# Patient Record
Sex: Male | Born: 1985 | Race: Black or African American | Hispanic: No | Marital: Married | Smoking: Current every day smoker
Health system: Southern US, Community
[De-identification: ages and names within clinical notes are randomized; demographics above are authoritative.]

## PROBLEM LIST (undated history)

## (undated) DIAGNOSIS — I10 Essential (primary) hypertension: Secondary | ICD-10-CM

## (undated) DIAGNOSIS — K529 Noninfective gastroenteritis and colitis, unspecified: Secondary | ICD-10-CM

## (undated) HISTORY — PX: APPENDECTOMY: SHX54

## (undated) HISTORY — PX: OTHER SURGICAL HISTORY: SHX169

---

## 2008-07-30 ENCOUNTER — Emergency Department: Payer: Self-pay | Admitting: Emergency Medicine

## 2008-08-02 ENCOUNTER — Emergency Department: Payer: Self-pay | Admitting: Emergency Medicine

## 2008-08-03 ENCOUNTER — Inpatient Hospital Stay: Payer: Self-pay | Admitting: Internal Medicine

## 2008-08-20 ENCOUNTER — Inpatient Hospital Stay: Payer: Self-pay | Admitting: Unknown Physician Specialty

## 2009-07-14 ENCOUNTER — Emergency Department: Payer: Self-pay | Admitting: Emergency Medicine

## 2014-08-03 ENCOUNTER — Emergency Department: Payer: Self-pay | Admitting: Emergency Medicine

## 2021-01-08 ENCOUNTER — Ambulatory Visit
Admission: EM | Admit: 2021-01-08 | Discharge: 2021-01-08 | Disposition: A | Discharge status: Home / Self Care | Payer: 59 | Attending: Sports Medicine | Admitting: Sports Medicine

## 2021-01-08 ENCOUNTER — Ambulatory Visit
Admission: RE | Admit: 2021-01-08 | Discharge: 2021-01-08 | Disposition: A | Discharge status: Home / Self Care | Payer: 59 | Source: Ambulatory Visit | Attending: Sports Medicine | Admitting: Sports Medicine

## 2021-01-08 ENCOUNTER — Other Ambulatory Visit: Payer: Self-pay

## 2021-01-08 ENCOUNTER — Encounter: Payer: Self-pay | Admitting: Emergency Medicine

## 2021-01-08 DIAGNOSIS — Z841 Family history of disorders of kidney and ureter: Secondary | ICD-10-CM | POA: Insufficient documentation

## 2021-01-08 DIAGNOSIS — Z20822 Contact with and (suspected) exposure to covid-19: Secondary | ICD-10-CM | POA: Diagnosis not present

## 2021-01-08 DIAGNOSIS — I10 Essential (primary) hypertension: Secondary | ICD-10-CM

## 2021-01-08 DIAGNOSIS — R519 Headache, unspecified: Secondary | ICD-10-CM | POA: Insufficient documentation

## 2021-01-08 DIAGNOSIS — F17210 Nicotine dependence, cigarettes, uncomplicated: Secondary | ICD-10-CM | POA: Diagnosis not present

## 2021-01-08 DIAGNOSIS — H539 Unspecified visual disturbance: Secondary | ICD-10-CM

## 2021-01-08 DIAGNOSIS — Z880 Allergy status to penicillin: Secondary | ICD-10-CM | POA: Diagnosis not present

## 2021-01-08 DIAGNOSIS — R059 Cough, unspecified: Secondary | ICD-10-CM | POA: Insufficient documentation

## 2021-01-08 HISTORY — DX: Essential (primary) hypertension: I10

## 2021-01-08 LAB — BASIC METABOLIC PANEL
Anion gap: 5 (ref 5–15)
BUN: 14 mg/dL (ref 6–20)
CO2: 32 mmol/L (ref 22–32)
Calcium: 9.3 mg/dL (ref 8.9–10.3)
Chloride: 101 mmol/L (ref 98–111)
Creatinine, Ser: 0.92 mg/dL (ref 0.61–1.24)
GFR, Estimated: >60 mL/min (ref >60)
Glucose, Bld: 98 mg/dL (ref 70–99)
Potassium: 4.3 mmol/L (ref 3.5–5.1)
Sodium: 138 mmol/L (ref 135–145)

## 2021-01-08 LAB — URINALYSIS, COMPLETE (UACMP) WITH MICROSCOPIC
Bacteria, UA: NONE SEEN
Bilirubin Urine: NEGATIVE
Glucose, UA: NEGATIVE mg/dL
Hgb urine dipstick: NEGATIVE
Ketones, ur: NEGATIVE mg/dL
Leukocytes,Ua: NEGATIVE
Nitrite: NEGATIVE
Protein, ur: NEGATIVE mg/dL
RBC / HPF: NONE SEEN RBC/hpf (ref 0–5)
Specific Gravity, Urine: 1.025 (ref 1.005–1.030)
Squamous Epithelial / HPF: NONE SEEN (ref 0–5)
pH: 6.5 (ref 5.0–8.0)

## 2021-01-08 LAB — CBC WITH DIFFERENTIAL/PLATELET
Abs Immature Granulocytes: 0.03 10*3/uL (ref 0.00–0.07)
Basophils Absolute: 0 10*3/uL (ref 0.0–0.1)
Basophils Relative: 1 %
Eosinophils Absolute: 0.2 10*3/uL (ref 0.0–0.5)
Eosinophils Relative: 2 %
HCT: 47.3 % (ref 39.0–52.0)
Hemoglobin: 15.8 g/dL (ref 13.0–17.0)
Immature Granulocytes: 0 %
Lymphocytes Relative: 40 %
Lymphs Abs: 2.7 10*3/uL (ref 0.7–4.0)
MCH: 29.7 pg (ref 26.0–34.0)
MCHC: 33.4 g/dL (ref 30.0–36.0)
MCV: 88.9 fL (ref 80.0–100.0)
Monocytes Absolute: 0.4 10*3/uL (ref 0.1–1.0)
Monocytes Relative: 6 %
Neutro Abs: 3.4 10*3/uL (ref 1.7–7.7)
Neutrophils Relative %: 51 %
Platelets: 262 10*3/uL (ref 150–400)
RBC: 5.32 MIL/uL (ref 4.22–5.81)
RDW: 13.1 % (ref 11.5–15.5)
WBC: 6.7 10*3/uL (ref 4.0–10.5)
nRBC: 0 % (ref 0.0–0.2)

## 2021-01-08 LAB — SARS CORONAVIRUS 2 (TAT 6-24 HRS): SARS Coronavirus 2: NEGATIVE

## 2021-01-08 MED ORDER — IOHEXOL 300 MG/ML  SOLN
75.0000 mL | Freq: Once | INTRAMUSCULAR | Status: AC | PRN
  Administered 2021-01-08: 75 mL via INTRAVENOUS

## 2021-01-08 NOTE — ED Notes (Signed)
Insurance does not require prior authorization per [facility].

## 2021-01-08 NOTE — Discharge Instructions (Addendum)
Given his symptoms and his family history as well as personal history of hypertension not on any medication I recommended that we obtain advanced imaging.  We will see if we can get insurance to approve a CT of his head with and without contrast to rule out an intracranial bleed as well as a space-occupying lesion. Given that he is going to be getting contrast we will obtain a creatinine for radiology. Hopefully we can get that today and I will give him a call with his results.  At that time we will dictate further medical care. For now given that he has high blood pressure and unknown renal issues, I have recommended just Tylenol for his headache.  No narcotics or NSAIDs. We will be in touch and we will discharge him for an outpatient CT scan.

## 2021-01-08 NOTE — ED Triage Notes (Signed)
Patient c/o headache that started yesterday after a coughing that day.  Patient states that the pain is on top of his head.  Patient denies fevers.

## 2021-01-08 NOTE — ED Provider Notes (Signed)
[facility]    CSN: [id number] Arrival date & time: 01/08/21  1107      History   Chief Complaint Chief Complaint  Patient presents with  . Headache    HPI Dominic Armstrong. is a 35 y.o. male.   Patient pleasant 35 year old male who presents for evaluation of a headache.  He reports about 10 days ago he had a sneezing fit and had a significant headache afterwards.  Seems to be focused on the top of his head.  He reports some blurry vision afterwards but it seemed to resolve.  Unfortunately, yesterday he had a coughing episode and his headache returned with associated blurry vision.  He says "my vision is foggy".  His vision has improved today but the headache has persisted since last night.  He denies any COVID symptoms.  No cough, sore throat, chest pain, shortness of breath, myalgias, fever shakes or chills, nausea vomiting or diarrhea, urinary or abdominal symptoms.  He has not been vaccinated against COVID and has had no COVID exposure although he works at the [employer] and is with the public all the time.  He has no personal COVID history.     History reviewed. No pertinent past medical history.  There are no problems to display for this patient.   Past Surgical History:  Procedure Laterality Date  . APPENDECTOMY         Home Medications    Prior to Admission medications   Not on File    Family History History reviewed. No pertinent family history.  Social History Social History   Tobacco Use  . Smoking status: Current Every Day Smoker    Types: Cigarettes  . Smokeless tobacco: Never Used  Vaping Use  . Vaping Use: Never used  Substance Use Topics  . Alcohol use: Yes  . Drug use: Yes    Types: Marijuana     Allergies   Penicillin g   Review of Systems Review of Systems  Constitutional: Negative for activity change, appetite change, chills, fatigue and fever.  HENT: Negative for congestion, ear discharge, ear pain,  sinus pressure, sinus pain, sneezing and sore throat.   Eyes: Positive for visual disturbance. Negative for photophobia, pain and redness.  Respiratory: Negative for cough, chest tightness, shortness of breath, wheezing and stridor.   Cardiovascular: Negative for chest pain and palpitations.  Gastrointestinal: Negative for abdominal pain.  Genitourinary: Negative for dysuria.  Musculoskeletal: Negative for myalgias, neck pain and neck stiffness.  Skin: Negative for color change, pallor, rash and wound.  Neurological: Positive for headaches. Negative for dizziness, syncope, weakness, light-headedness and numbness.  All other systems reviewed and are negative.    Physical Exam Triage Vital Signs ED Triage Vitals  Enc Vitals Group     BP 01/08/21 1150 (!) 147/116     Pulse Rate 01/08/21 1150 78     Resp 01/08/21 1150 16     Temp 01/08/21 1150 98.6 F (37 C)     Temp Source 01/08/21 1150 Oral     SpO2 01/08/21 1150 100 %     Weight 01/08/21 1147 225 lb (102.1 kg)     Height 01/08/21 1147 6\' 1"  (1.854 m)     Head Circumference --      Peak Flow --      Pain Score 01/08/21 1147 8     Pain Loc --      Pain Edu? --      Excl. in [place]? --  No data found.  Updated Vital Signs BP (!) 147/116 (BP Location: Right Arm)   Pulse 78   Temp 98.6 F (37 C) (Oral)   Resp 16   Ht 6\' 1"  (1.854 m)   Wt 102.1 kg   SpO2 100%   BMI 29.69 kg/m   Visual Acuity Right Eye Distance: 20/70 uncorrected Left Eye Distance: 20/40 uncorrected Bilateral Distance: 20/25 uncorrected  Right Eye Near:   Left Eye Near:    Bilateral Near:     Physical Exam Vitals and nursing note reviewed.  Constitutional:      General: He is not in acute distress.    Appearance: He is well-developed. He is not ill-appearing, toxic-appearing or diaphoretic.  HENT:     Head: Normocephalic and atraumatic.  Eyes:     General: No visual field deficit.    Extraocular Movements: Extraocular movements intact.      Right eye: No nystagmus.     Left eye: No nystagmus.     Pupils: Pupils are equal, round, and reactive to light.  Neck:     Meningeal: Brudzinski's sign and Kernig's sign absent.  Cardiovascular:     Rate and Rhythm: Normal rate and regular rhythm.     Heart sounds: Normal heart sounds. No murmur heard. No friction rub. No gallop.   Pulmonary:     Effort: Pulmonary effort is normal. No respiratory distress.     Breath sounds: Normal breath sounds. No stridor. No wheezing, rhonchi or rales.  Musculoskeletal:     Cervical back: Normal range of motion and neck supple. No rigidity.  Lymphadenopathy:     Cervical: No cervical adenopathy.  Skin:    General: Skin is warm and dry.     Capillary Refill: Capillary refill takes less than 2 seconds.  Neurological:     Mental Status: He is alert and oriented to person, place, and time.     GCS: GCS eye subscore is 4. GCS verbal subscore is 5. GCS motor subscore is 6.     Cranial Nerves: No cranial nerve deficit, dysarthria or facial asymmetry.     Sensory: No sensory deficit.     Motor: No weakness.     Coordination: Coordination normal.     Deep Tendon Reflexes: Reflexes normal.  Psychiatric:        Mood and Affect: Mood normal.        Speech: Speech normal.        Behavior: Behavior normal.      UC Treatments / Results  Labs (all labs ordered are listed, but only abnormal results are displayed) Labs Reviewed  URINALYSIS, COMPLETE (UACMP) WITH MICROSCOPIC - Abnormal; Notable for the following components:      Result Value   APPearance HAZY (*)    All other components within normal limits  SARS CORONAVIRUS 2 (TAT 6-24 HRS)  BASIC METABOLIC PANEL  CBC WITH DIFFERENTIAL/PLATELET    EKG   Radiology No results found.  Procedures Procedures (including critical care time)  Medications Ordered in UC Medications - No data to display  Initial Impression / Assessment and Plan / UC Course  I have reviewed the triage vital signs  and the nursing notes.  Pertinent labs & imaging results that were available during my care of the patient were reviewed by me and considered in my medical decision making (see chart for details).  Clinical impression: Several episodes of headache after sneezing and coughing.  Although his exam is grossly nonfocal he is unsure of  his family history and whether or not there is any history of polycystic kidney disease.  That said, he does have a significant history of renal disease in several family members.  Treatment plan: 1.  The findings and treatment plan were discussed in detail with the patient.  Patient was in agreement. 2.  Given his symptoms and his family history as well as personal history of hypertension not on any medication I recommended that we obtain advanced imaging.  We will see if we can get insurance to approve a CT of his head with and without contrast to rule out an intracranial bleed as well as a space-occupying lesion.  Doubt that he has a berry aneurysm, but if his noncontrast and contrast CT come back within normal limits he may need a CTA in the future.  He also may need a neurology consult if his headaches persist. 3.  Given that he is going to be getting contrast we will obtain a creatinine for radiology. 4.  Hopefully we can get that today and I will give him a call with his results.  At that time we will dictate further medical care. 5.  For now given that he has high blood pressure and unknown renal issues, I have recommended just Tylenol for his headache.  No narcotics or NSAIDs. 6.  We will be in touch and we will discharge him for an outpatient CT scan.    Final Clinical Impressions(s) / UC Diagnoses   Final diagnoses:  Acute intractable headache, unspecified headache type  Changes in vision  Hypertension, unspecified type     Discharge Instructions     Given his symptoms and his family history as well as personal history of hypertension not on any  medication I recommended that we obtain advanced imaging.  We will see if we can get insurance to approve a CT of his head with and without contrast to rule out an intracranial bleed as well as a space-occupying lesion. Given that he is going to be getting contrast we will obtain a creatinine for radiology. Hopefully we can get that today and I will give him a call with his results.  At that time we will dictate further medical care. For now given that he has high blood pressure and unknown renal issues, I have recommended just Tylenol for his headache.  No narcotics or NSAIDs. We will be in touch and we will discharge him for an outpatient CT scan.    ED Prescriptions    None     PDMP not reviewed this encounter.   Delton See, MD 01/08/21 [phone]

## 2021-01-12 ENCOUNTER — Telehealth: Payer: Self-pay | Admitting: Sports Medicine

## 2021-01-12 NOTE — Telephone Encounter (Signed)
Called patient on 01/10/21 at 12pm..  All questions were answered

## 2021-02-20 ENCOUNTER — Ambulatory Visit: Payer: 59 | Admitting: Family Medicine

## 2021-09-11 IMAGING — CT CT HEAD WO/W CM
3 of 4 series · 15 of 47 positions shown, 18 images · IV contrast (APPLIED)
Comparison: None.

CLINICAL DATA: Headache. Concern for acute hemorrhage or mass
lesion.

EXAM:
CT HEAD WITHOUT AND WITH CONTRAST
TECHNIQUE: Contiguous axial images were obtained from the base of the skull
through the vertex without and with intravenous contrast
CONTRAST:  75mL OMNIPAQUE IOHEXOL 300 MG/ML  SOLN

[Series 2: head wo · axial · 0.46mm/px · z∈[-122,+13]mm · 9 of 33 slices shown, 12 images]
[im 3/33  brain]
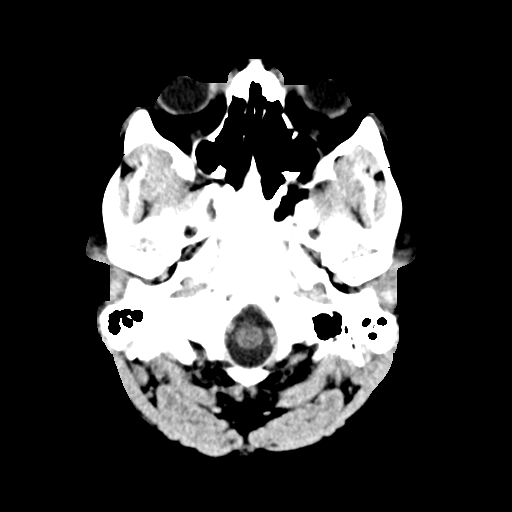
[im 3/33  bone]
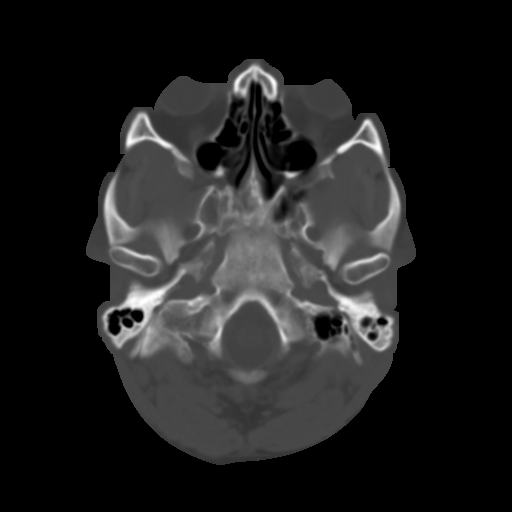
[im 7/33  brain]
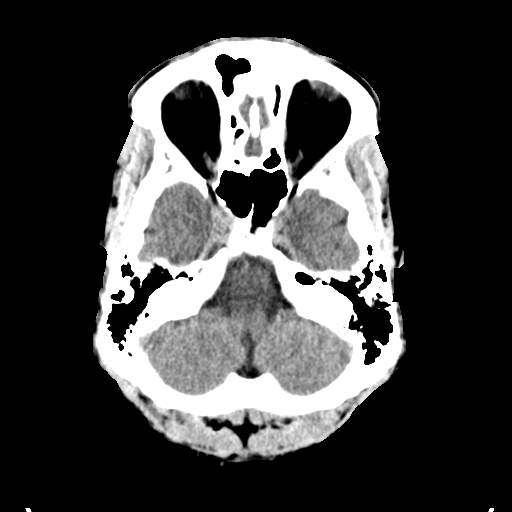
[im 9/33  brain]
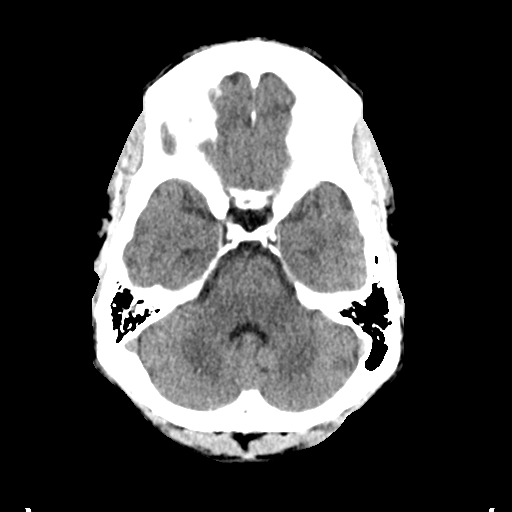
[im 13/33  brain]
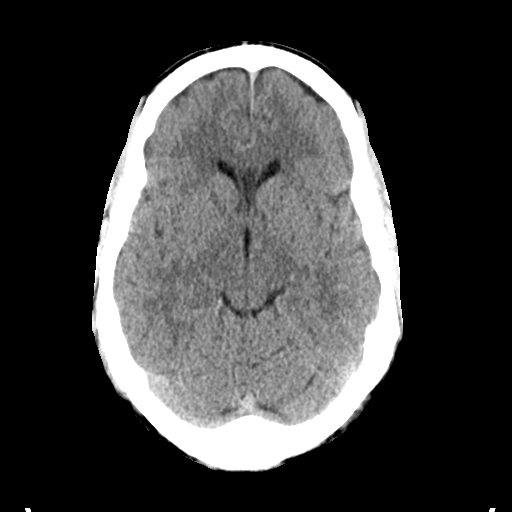
[im 18/33  brain]
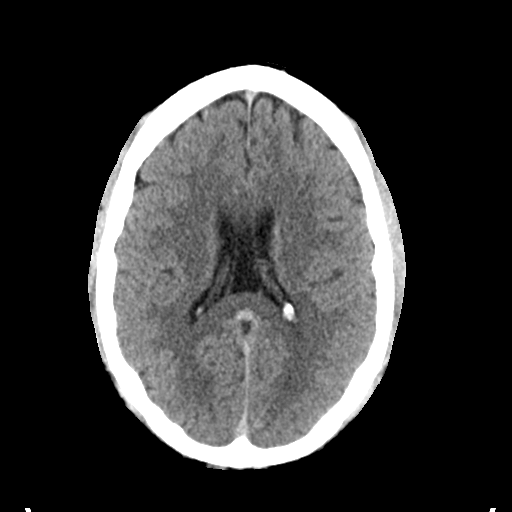
[im 18/33  bone]
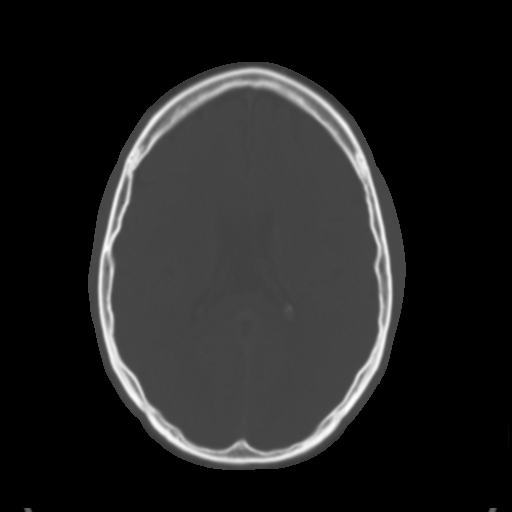
[im 20/33  brain]
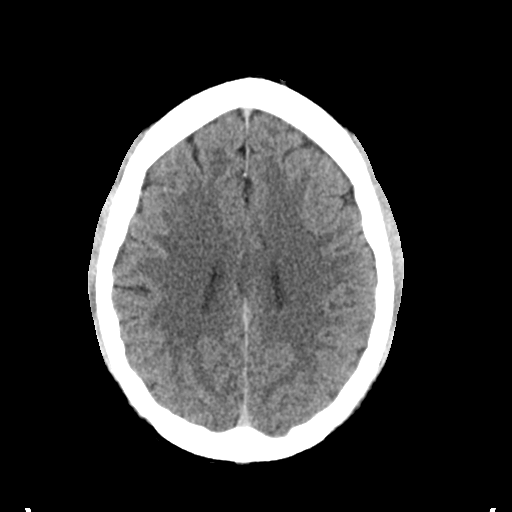
[im 24/33  brain]
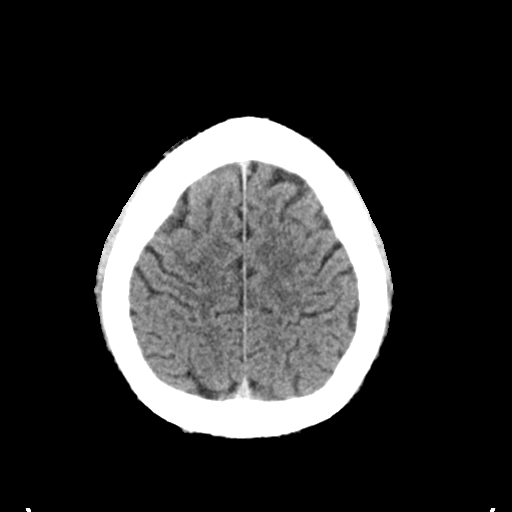
[im 26/33  brain]
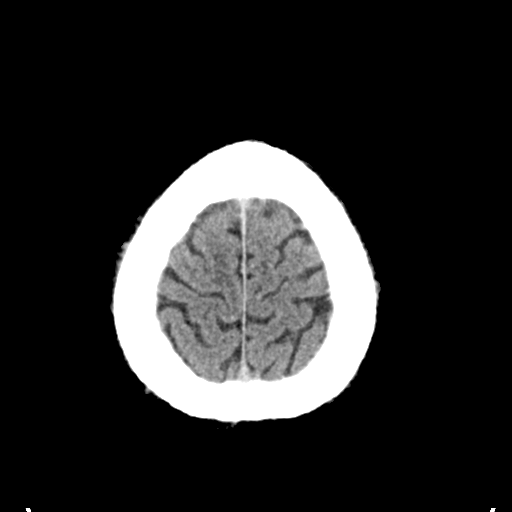
[im 30/33  brain]
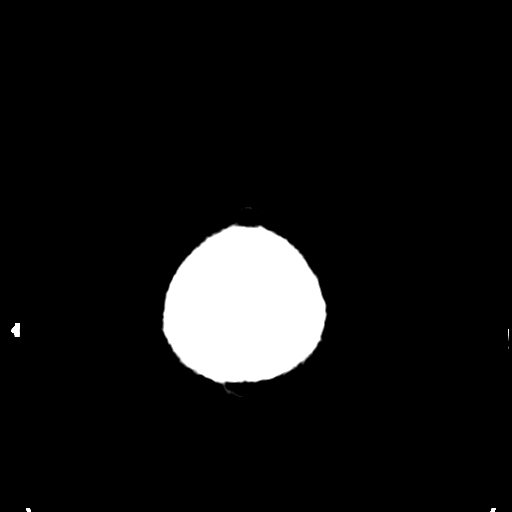
[im 30/33  bone]
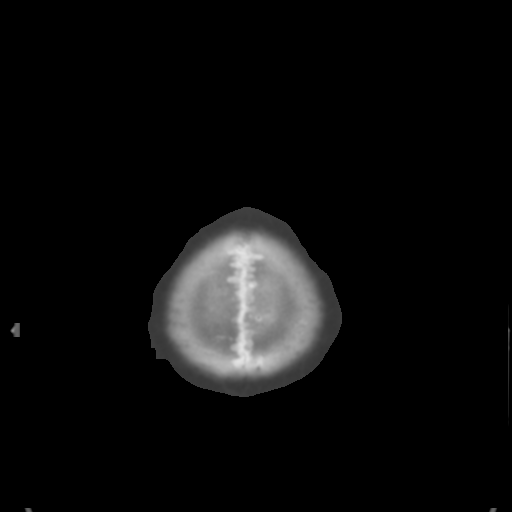

[Series 5: coronal soft tissue · coronal · 0.31mm/px · 3 of 72 slices shown]
[im 24/72  brain]
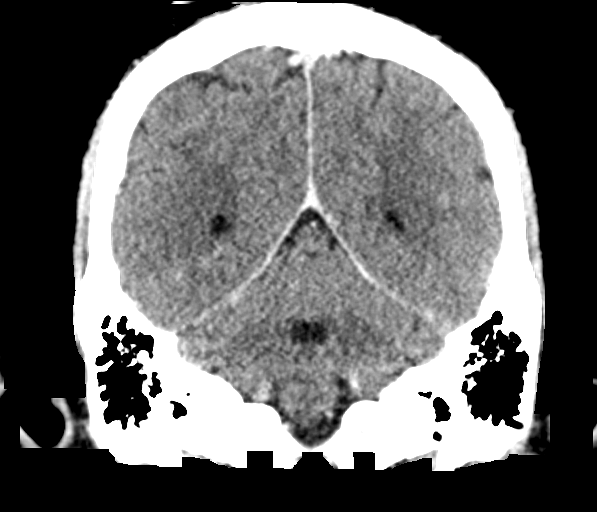
[im 32/72  brain]
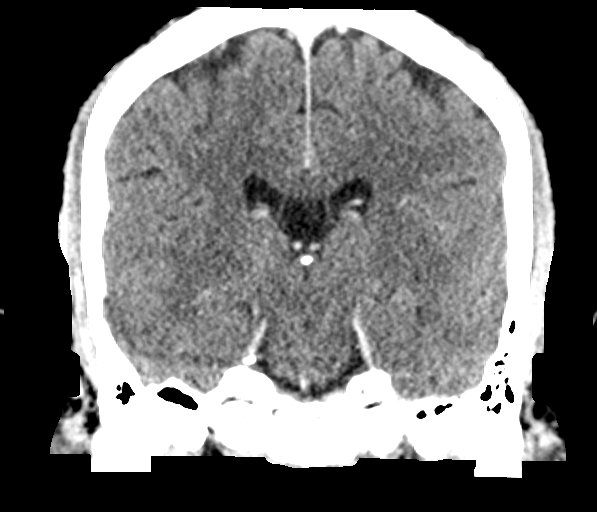
[im 40/72  brain]
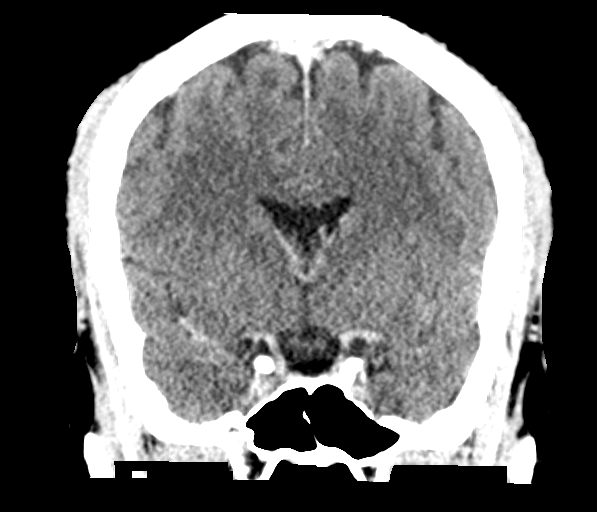

[Series 6: sagittal soft tissue · sagittal · 0.33mm/px · 3 of 59 slices shown]
[im 20/59  brain]
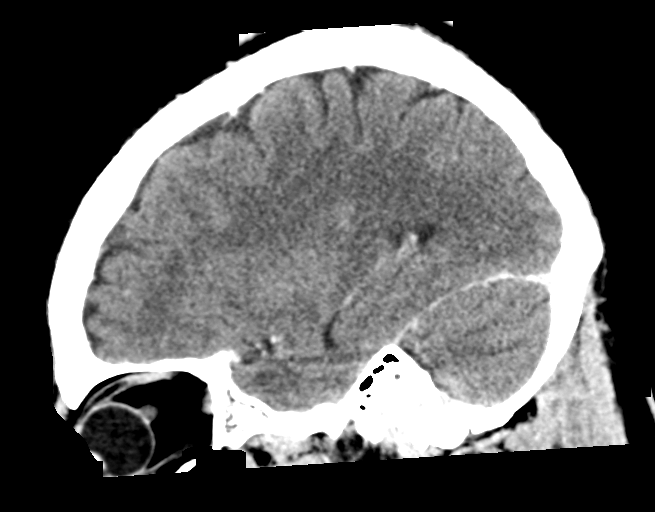
[im 30/59  brain]
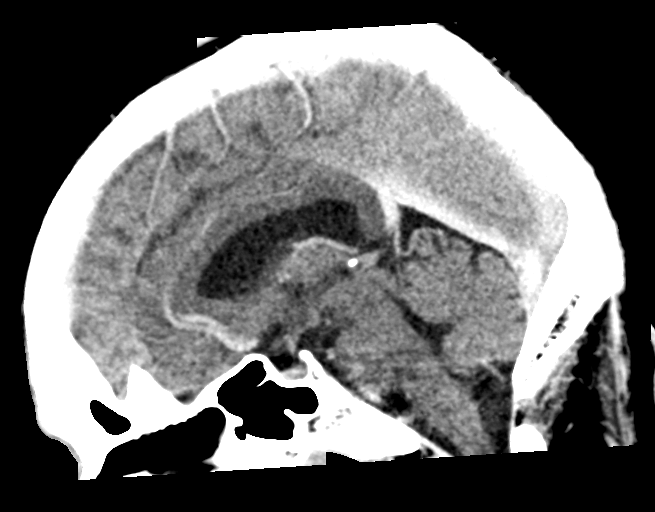
[im 39/59  brain]
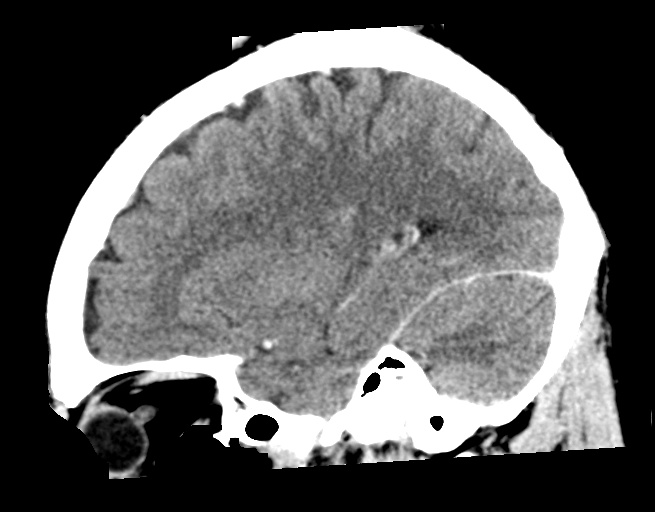

[15 of 47 positions shown; findings below may reference images not displayed]

FINDINGS: Brain: No evidence of acute large vascular territory infarction,
hemorrhage, hydrocephalus, extra-axial collection or mass
lesion/mass effect. Cavum septum pellucidum et vergae, anatomic
variant.

Vascular: No hyperdense vessel or unexpected calcification. Visible
vessels are patent.

Skull: No acute fracture.

Sinuses/Orbits: Visualized sinuses are clear.  Unremarkable orbits.

Other: No mastoid effusions.
IMPRESSION: No evidence of acute intracranial abnormality.

## 2022-01-04 ENCOUNTER — Ambulatory Visit
Admission: EM | Admit: 2022-01-04 | Discharge: 2022-01-04 | Disposition: A | Discharge status: Home / Self Care | Payer: 59

## 2022-01-04 ENCOUNTER — Other Ambulatory Visit: Payer: Self-pay

## 2022-01-04 DIAGNOSIS — M25511 Pain in right shoulder: Secondary | ICD-10-CM

## 2022-01-04 HISTORY — DX: Noninfective gastroenteritis and colitis, unspecified: K52.9

## 2022-01-04 MED ORDER — PREDNISONE 20 MG PO TABS
40.0000 mg | ORAL_TABLET | Freq: Every day | ORAL | 0 refills | Status: AC
Start: 2022-01-04 — End: ?

## 2022-01-04 MED ORDER — KETOROLAC TROMETHAMINE 60 MG/2ML IM SOLN
30.0000 mg | Freq: Once | INTRAMUSCULAR | Status: AC
  Administered 2022-01-04: 30 mg via INTRAMUSCULAR

## 2022-01-04 NOTE — ED Provider Notes (Signed)
[facility]    CSN: [id number] Arrival date & time: 01/04/22  1734      History   Chief Complaint Chief Complaint  Patient presents with   Right Shoulder Pain    HPI Dominic Armstrong. is a 36 y.o. male.    Patient presents with right-sided shoulder pain radiating down arm into the fingertips with associated numbness and tingling for 5 days.  Symptoms began while asleep.  Pain is described as throbbing.  Range of motion is intact but elicits pain.  Symptoms interfering with sleeping.  Endorses knots along the shoulder blade, when pressed back into the skin he was able to regain sensation in fingers.  Has attempted use of ibuprofen, Tylenol and Flexeril which have not been helpful.  History of rotator cuff injury.   Past Medical History:  Diagnosis Date   Colitis    Hypertension     There are no problems to display for this patient.   Past Surgical History:  Procedure Laterality Date   abs sx     removed part of colon   APPENDECTOMY         Home Medications    Prior to Admission medications   Medication Sig Start Date End Date Taking? Authorizing Provider  acetaminophen (TYLENOL) 325 MG tablet Take 650 mg by mouth every 6 (six) hours as needed.   Yes [provider]  cyclobenzaprine (FLEXERIL) 10 MG tablet Take 10 mg by mouth 3 (three) times daily as needed for muscle spasms.   Yes [provider]    Family History No family history on file.  Social History Social History   Tobacco Use   Smoking status: Every Day    Types: Cigarettes   Smokeless tobacco: Never  Vaping Use   Vaping Use: Never used  Substance Use Topics   Alcohol use: Not Currently   Drug use: Yes    Types: Marijuana     Allergies   Bee venom and Penicillin g   Review of Systems Review of Systems  Constitutional: Negative.   Respiratory: Negative.    Musculoskeletal:  Positive for arthralgias. Negative for back pain, gait problem, joint  swelling, myalgias, neck pain and neck stiffness.  Skin: Negative.   Neurological: Negative.     Physical Exam Triage Vital Signs ED Triage Vitals  Enc Vitals Group     BP 01/04/22 1747 (!) 169/105     Pulse Rate 01/04/22 1747 98     Resp 01/04/22 1747 18     Temp 01/04/22 1747 98.8 F (37.1 C)     Temp Source 01/04/22 1747 Oral     SpO2 01/04/22 1747 100 %     Weight 01/04/22 1749 225 lb (102.1 kg)     Height 01/04/22 1749 6\' 1"  (1.854 m)     Head Circumference --      Peak Flow --      Pain Score 01/04/22 1749 8     Pain Loc --      Pain Edu? --      Excl. in [place]? --    No data found.  Updated Vital Signs BP (!) 159/105 (BP Location: Right Arm)    Pulse 98    Temp 98.8 F (37.1 C) (Oral)    Resp 18    Ht 6\' 1"  (1.854 m)    Wt 225 lb (102.1 kg)    SpO2 100%    BMI 29.69 kg/m   Visual Acuity Right Eye Distance:  Left Eye Distance:   Bilateral Distance:    Right Eye Near:   Left Eye Near:    Bilateral Near:     Physical Exam Constitutional:      Appearance: Normal appearance.  Eyes:     Extraocular Movements: Extraocular movements intact.  Pulmonary:     Effort: Pulmonary effort is normal.  Musculoskeletal:     Comments: Unable to reproduce tenderness on exam, spasm of muscle along the right shoulder blade noted, no swelling, deformity, ecchymosis noted, range of motion intact but pain elicited with lateral extension  Skin:    General: Skin is warm and dry.  Neurological:     Mental Status: He is alert and oriented to person, place, and time. Mental status is at baseline.  Psychiatric:        Mood and Affect: Mood normal.        Behavior: Behavior normal.     UC Treatments / Results  Labs (all labs ordered are listed, but only abnormal results are displayed) Labs Reviewed - No data to display  EKG   Radiology No results found.  Procedures Procedures (including critical care time)  Medications Ordered in UC Medications - No data to  display  Initial Impression / Assessment and Plan / UC Course  I have reviewed the triage vital signs and the nursing notes.  Pertinent labs & imaging results that were available during my care of the patient were reviewed by me and considered in my medical decision making (see chart for details).  Acute right shoulder pain  Etiology of symptoms most likely muscular, discussed with patient, will defer imaging at this time due to lack of injury, Toradol injection given in office, prescribed 40 mg 5-day prednisone course, may continue use of over-the-counter Tylenol and Flexeril as needed, recommended RICE, daily stretching, heat in 15-minute intervals, pillows for support and activity as tolerated, walking referral to orthopedics given Final Clinical Impressions(s) / UC Diagnoses   Final diagnoses:  None   Discharge Instructions   None    ED Prescriptions   None    PDMP not reviewed this encounter.   Hans Eden, NP 01/04/22 1815

## 2022-01-04 NOTE — ED Triage Notes (Signed)
Pt reports believes he slept wrong and now has right shoulder pain posterior that radiates into his finger tips. Reports "knots" in his right shoulder blade. This has been for 3 days now, no known injury  OTC IBU, Tylenol, and an old flexeril.

## 2022-01-04 NOTE — Discharge Instructions (Signed)
Your pain is most likely caused by irritation to the muscles or ligaments.   Starting tomorrow take prednisone every morning with food for the next 5 days  You may continue use of Tylenol and Flexeril for further management  You may use heating pad in 15 minute intervals as needed for additional comfort or you may find comfort in using ice in 10-15 minutes over affected area  Begin stretching affected area daily for 10 minutes as tolerated to further loosen muscles   When lying down place pillow underneath shoulder blade   Can try sleeping without pillow on firm mattress   Practice good posture: head back, shoulders back, chest forward, pelvis back and weight distributed evenly on both legs  If pain persist after recommended treatment or reoccurs if may be beneficial to follow up with orthopedic specialist for evaluation, this doctor specializes in the bones and can manage your symptoms long-term with options such as but not limited to imaging, medications or physical therapy

## 2022-01-05 ENCOUNTER — Emergency Department: Payer: 59

## 2022-01-05 ENCOUNTER — Encounter: Payer: Self-pay | Admitting: Emergency Medicine

## 2022-01-05 ENCOUNTER — Other Ambulatory Visit: Payer: Self-pay

## 2022-01-05 DIAGNOSIS — I10 Essential (primary) hypertension: Secondary | ICD-10-CM | POA: Diagnosis not present

## 2022-01-05 DIAGNOSIS — Y9372 Activity, wrestling: Secondary | ICD-10-CM | POA: Insufficient documentation

## 2022-01-05 DIAGNOSIS — X58XXXA Exposure to other specified factors, initial encounter: Secondary | ICD-10-CM | POA: Diagnosis not present

## 2022-01-05 DIAGNOSIS — S4991XA Unspecified injury of right shoulder and upper arm, initial encounter: Secondary | ICD-10-CM | POA: Diagnosis present

## 2022-01-05 DIAGNOSIS — S46001A Unspecified injury of muscle(s) and tendon(s) of the rotator cuff of right shoulder, initial encounter: Secondary | ICD-10-CM | POA: Insufficient documentation

## 2022-01-05 NOTE — ED Triage Notes (Signed)
Pt to ED via POV with co right shoulder pain, he states that he was wrestling with his kid about two weeks ago and can no longer take the pain. No deformity or swelling seen.

## 2022-01-06 ENCOUNTER — Emergency Department
Admission: EM | Admit: 2022-01-06 | Discharge: 2022-01-06 | Disposition: A | Discharge status: Home / Self Care | Payer: 59 | Attending: Emergency Medicine | Admitting: Emergency Medicine

## 2022-01-06 DIAGNOSIS — M25511 Pain in right shoulder: Secondary | ICD-10-CM

## 2022-01-06 DIAGNOSIS — S46001A Unspecified injury of muscle(s) and tendon(s) of the rotator cuff of right shoulder, initial encounter: Secondary | ICD-10-CM

## 2022-01-06 MED ORDER — OXYCODONE-ACETAMINOPHEN 5-325 MG PO TABS: 1.0000 | ORAL_TABLET | ORAL | 0 refills | Status: DC | PRN

## 2022-01-06 MED ORDER — OXYCODONE-ACETAMINOPHEN 5-325 MG PO TABS
1.0000 | ORAL_TABLET | Freq: Once | ORAL | Status: AC
Start: 2022-01-06 — End: 2022-01-06
  Administered 2022-01-06: 1 via ORAL
  Filled 2022-01-06: qty 1

## 2022-01-06 MED ORDER — KETOROLAC TROMETHAMINE 60 MG/2ML IM SOLN
30.0000 mg | Freq: Once | INTRAMUSCULAR | Status: AC
Start: 2022-01-06 — End: 2022-01-06
  Administered 2022-01-06: 30 mg via INTRAMUSCULAR
  Filled 2022-01-06: qty 2

## 2022-01-06 NOTE — ED Provider Notes (Signed)
[facility] Provider Note    Event Date/Time   First MD Initiated Contact with Patient 01/06/22 0007     (approximate)   History   Shoulder Injury   HPI  Dominic Armstrong. is a 36 y.o. male who presents to the ED from home with a chief complaint of traumatic right shoulder pain.  Patient is right-hand dominant who was wrestling with his 44-year-old son approximately 2 weeks ago and injured his right shoulder.  He does have an old rotator cuff injury from working several years ago.  Seen at urgent care 01/04/2022 and placed on Prednisone for cervical radiculopathy, Ibuprofen and Flexeril.  States pain is not controlled by these medications.  States pain is worse while sleeping because he cannot lay on that side.  Endorses occasional numbness and tingling into his fingertips.  Denies extremity weakness.     Past Medical History   Past Medical History:  Diagnosis Date   Colitis    Hypertension      Active Problem List  There are no problems to display for this patient.    Past Surgical History   Past Surgical History:  Procedure Laterality Date   abs sx     removed part of colon   APPENDECTOMY       Home Medications   Prior to Admission medications   Medication Sig Start Date End Date Taking? Authorizing Provider  oxyCODONE-acetaminophen (PERCOCET/ROXICET) 5-325 MG tablet Take 1 tablet by mouth every 4 (four) hours as needed for severe pain. 01/06/22  Yes Paulette Blanch, MD  acetaminophen (TYLENOL) 325 MG tablet Take 650 mg by mouth every 6 (six) hours as needed.    [provider]  cyclobenzaprine (FLEXERIL) 10 MG tablet Take 10 mg by mouth 3 (three) times daily as needed for muscle spasms.    [provider]  predniSONE (DELTASONE) 20 MG tablet Take 2 tablets (40 mg total) by mouth daily. 01/04/22   Hans Eden, NP     Allergies  Bee venom and Penicillin g   Family History  No family history on  file.   Physical Exam  Triage Vital Signs: ED Triage Vitals  Enc Vitals Group     BP 01/05/22 2156 (!) 169/123     Pulse Rate 01/05/22 2156 99     Resp 01/05/22 2156 20     Temp 01/05/22 2156 98.3 F (36.8 C)     Temp Source 01/05/22 2156 Oral     SpO2 01/05/22 2156 99 %     Weight 01/05/22 2157 225 lb (102.1 kg)     Height 01/05/22 2157 6\' 1"  (1.854 m)     Head Circumference --      Peak Flow --      Pain Score 01/05/22 2157 10     Pain Loc --      Pain Edu? --      Excl. in [place]? --     Updated Vital Signs: BP (!) 169/123 (BP Location: Right Arm)    Pulse 99    Temp 98.3 F (36.8 C) (Oral)    Resp 20    Ht 6\' 1"  (1.854 m)    Wt 102.1 kg    SpO2 99%    BMI 29.69 kg/m    General: Awake, no distress.  CV:  Good peripheral perfusion.  Resp:  Normal effort.  Abd:  No distention.  Other:  Right shoulder tender to palpation with limited range of motion  secondary to pain.  2+ radial pulse.  Brisk, less than 5-second capillary refill.  5/5 motor strength and sensation.   ED Results / Procedures / Treatments  Labs (all labs ordered are listed, but only abnormal results are displayed) Labs Reviewed - No data to display   EKG  None   RADIOLOGY I have personally reviewed patient's right shoulder x-ray as well as the radiology interpretation:  Right shoulder x-ray: No acute osseous abnormality; remote rotator cuff injury  Official radiology report(s): DG Shoulder Right  Result Date: 01/05/2022 CLINICAL DATA:  shoulder pain EXAM: RIGHT SHOULDER - 2+ VIEW COMPARISON:  None. FINDINGS: There is no evidence of fracture or dislocation. There is a separate well-corticated 3.7 x 1.4 cm osseous density in the region of the expecting acromion that may represent acromial apophysiolysis or an os acromiale. Soft tissues are unremarkable. IMPRESSION: 1. A separate well-corticated 3.7 x 1.4 cm osseous density in the region of the expecting acromion that may represent acromial  apophysiolysis or an os acromiale. 2.  No acute displaced fracture or dislocation. Electronically Signed   By: Iven Finn M.D.   On: 01/05/2022 22:25     PROCEDURES:  Critical Care performed: No  Procedures   MEDICATIONS ORDERED IN ED: Medications  ketorolac (TORADOL) injection 30 mg (has no administration in time range)  oxyCODONE-acetaminophen (PERCOCET/ROXICET) 5-325 MG per tablet 1 tablet (has no administration in time range)     IMPRESSION / MDM / ASSESSMENT AND PLAN / ED COURSE  I reviewed the triage vital signs and the nursing notes.                             36 year old male presenting with right shoulder injury with pain.  I have personally reviewed patient's chart and reviewed his urgent care visit from 01/04/2022.  X-rays negative for acute osseous abnormality.  It is likely patient reaggravated his remote rotator cuff injury.  In addition to the ibuprofen, Flexeril and Prednisone he is already taking, will add Percocet for pain and sling for comfort.  Will refer to orthopedics for outpatient follow-up.  Strict return precautions given.  Patient verbalizes understanding and agrees with plan of care.      FINAL CLINICAL IMPRESSION(S) / ED DIAGNOSES   Final diagnoses:  Acute pain of right shoulder  Rotator cuff injury, right, initial encounter     Rx / DC Orders   ED Discharge Orders          Ordered    oxyCODONE-acetaminophen (PERCOCET/ROXICET) 5-325 MG tablet  Every 4 hours PRN        01/06/22 0026             Note:  This document was prepared using Dragon voice recognition software and may include unintentional dictation errors.   Paulette Blanch, MD 01/06/22 [id number]

## 2022-01-06 NOTE — Discharge Instructions (Signed)
1.  Continue all of your other medications.  You may add Percocet as needed for pain. 2.  Wear sling while awake.  You may remove to bathe and sleep. 3.  Return to the ER for worsening symptoms, persistent vomiting, difficulty breathing or other concerns.

## 2022-09-08 IMAGING — CR DG SHOULDER 2+V*R*
3 series · 3 of 3 positions shown · non-contrast
Comparison: None.

CLINICAL DATA: shoulder pain

EXAM:
RIGHT SHOULDER - 2+ VIEW

[shoulder grashey]
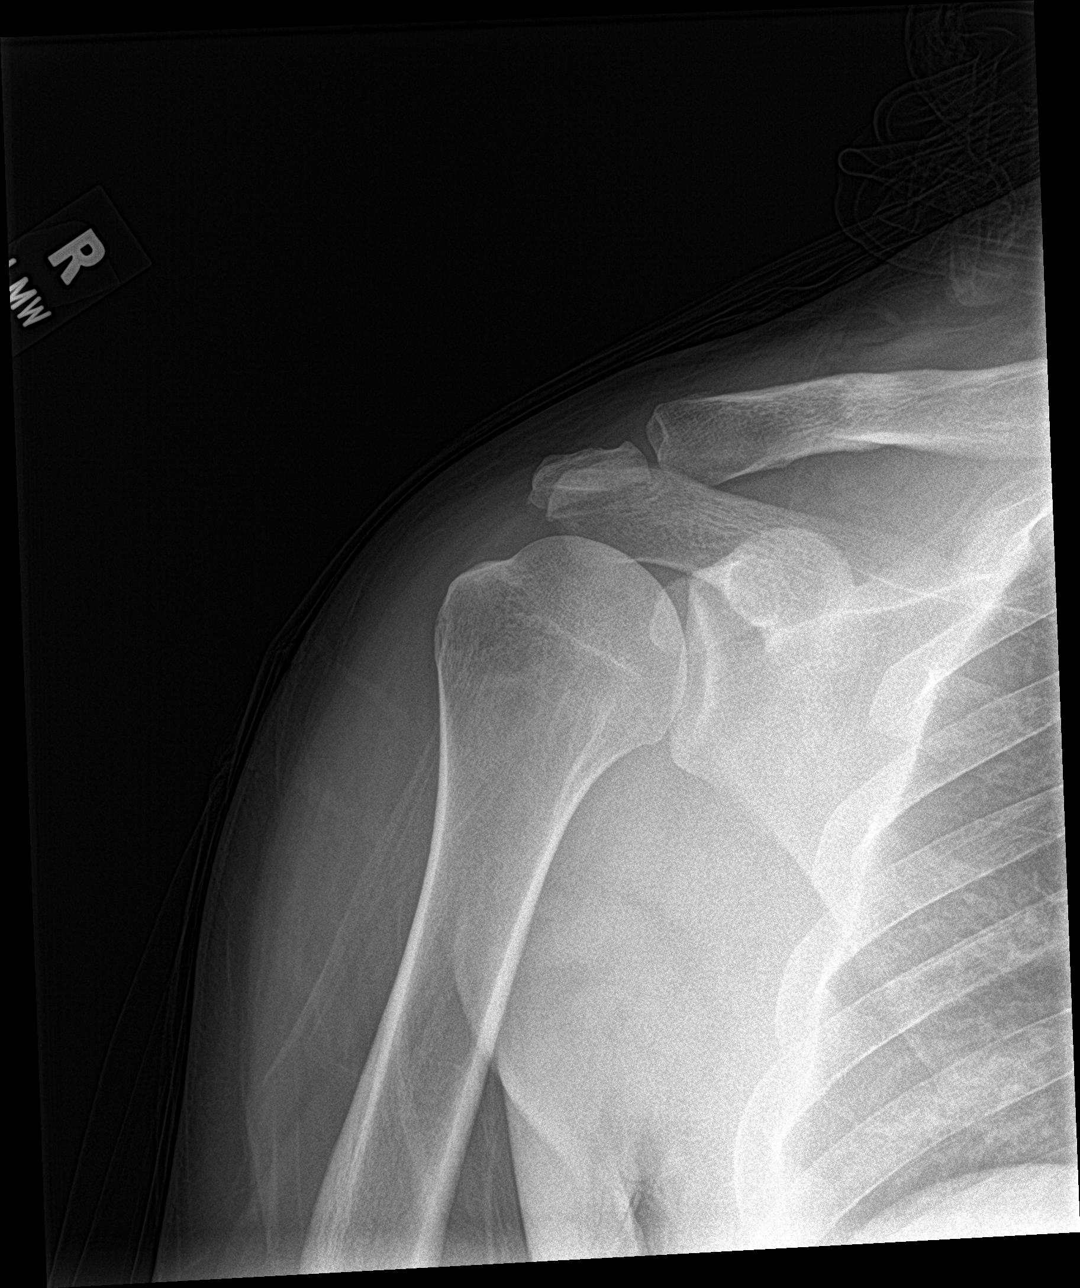

[shoulder y view]
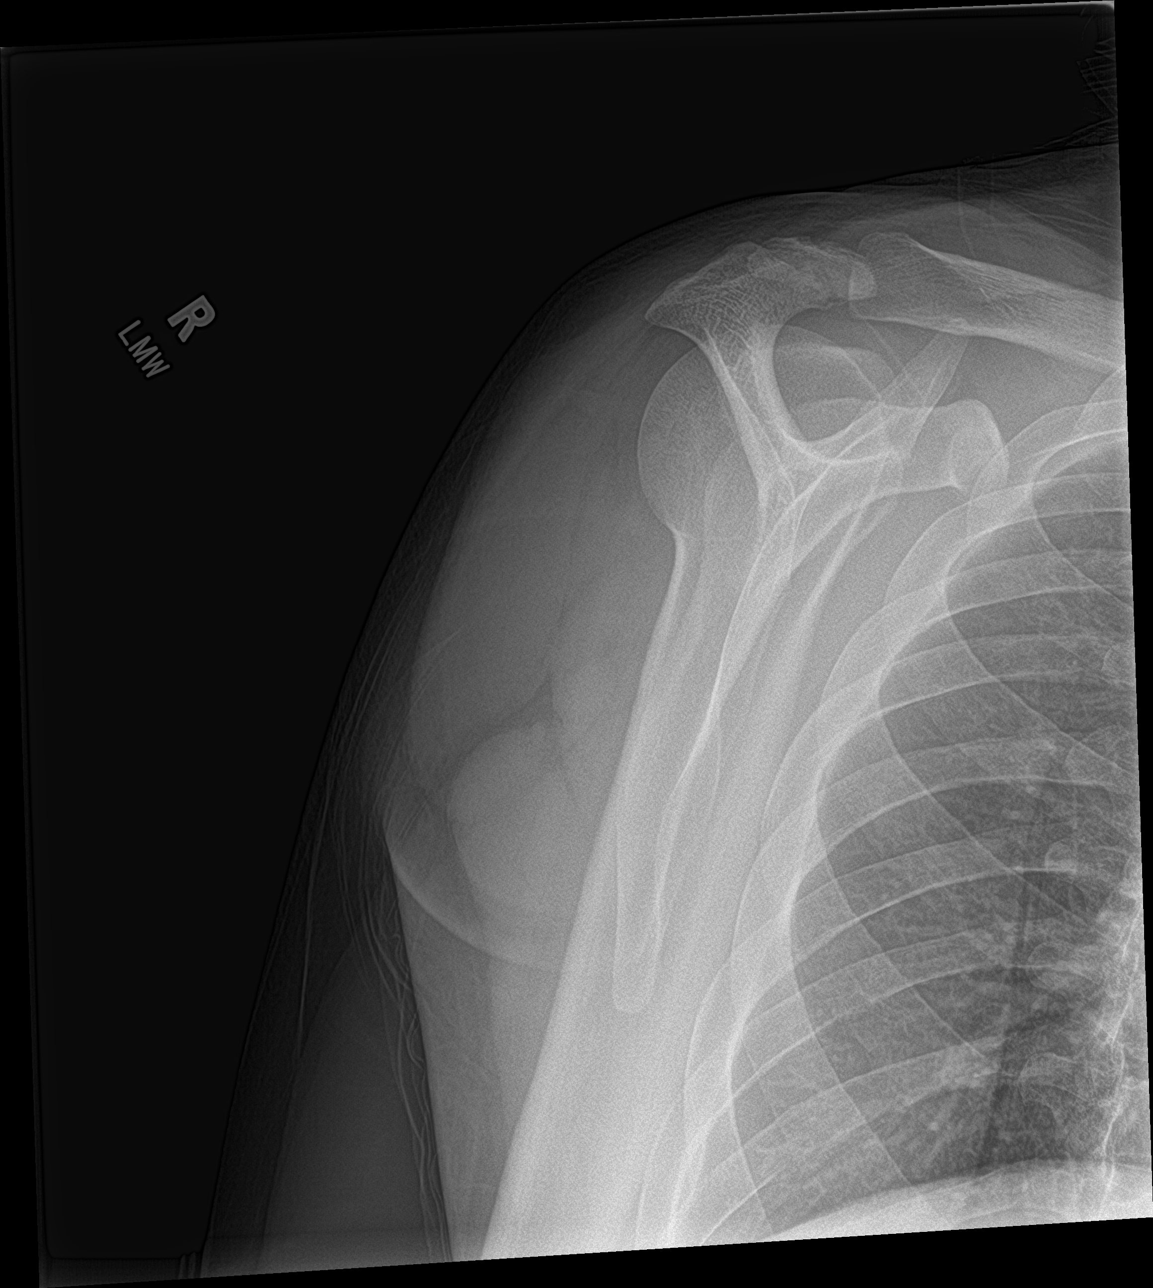

[shoulder ap neutral]
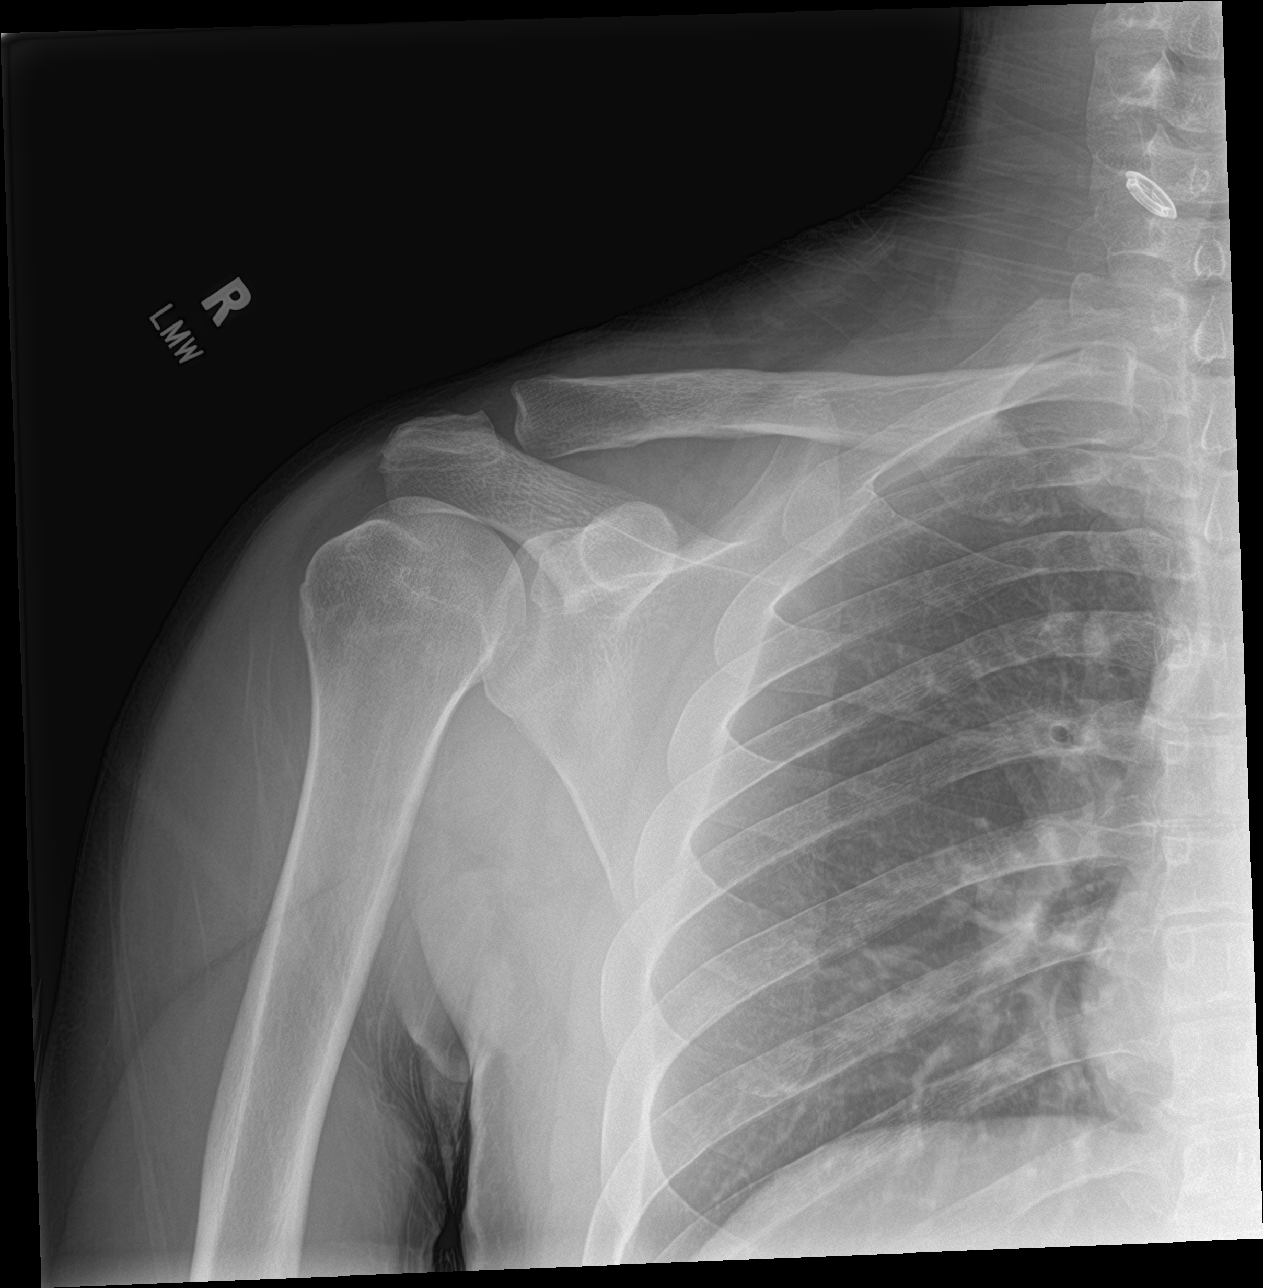

[3 of 3 positions shown; findings below may reference images not displayed]

FINDINGS: There is no evidence of fracture or dislocation. There is a separate
well-corticated 3.7 x 1.4 cm osseous density in the region of the
expecting acromion that may represent acromial apophysiolysis or an
os acromiale. Soft tissues are unremarkable.
IMPRESSION: 1. A separate well-corticated 3.7 x 1.4 cm osseous density in the
region of the expecting acromion that may represent acromial
apophysiolysis or an os acromiale.
2.  No acute displaced fracture or dislocation.

## 2023-12-02 ENCOUNTER — Emergency Department: Payer: Medicaid Other

## 2023-12-02 ENCOUNTER — Emergency Department
Admission: EM | Admit: 2023-12-02 | Discharge: 2023-12-02 | Disposition: A | Discharge status: Home / Self Care | Payer: Medicaid Other | Attending: Emergency Medicine | Admitting: Emergency Medicine

## 2023-12-02 ENCOUNTER — Other Ambulatory Visit: Payer: Self-pay

## 2023-12-02 DIAGNOSIS — N451 Epididymitis: Secondary | ICD-10-CM | POA: Insufficient documentation

## 2023-12-02 DIAGNOSIS — N5089 Other specified disorders of the male genital organs: Secondary | ICD-10-CM | POA: Diagnosis not present

## 2023-12-02 DIAGNOSIS — N433 Hydrocele, unspecified: Secondary | ICD-10-CM | POA: Diagnosis not present

## 2023-12-02 DIAGNOSIS — N50812 Left testicular pain: Secondary | ICD-10-CM | POA: Diagnosis present

## 2023-12-02 DIAGNOSIS — N5082 Scrotal pain: Secondary | ICD-10-CM | POA: Diagnosis not present

## 2023-12-02 LAB — URINALYSIS, ROUTINE W REFLEX MICROSCOPIC
Bilirubin Urine: NEGATIVE
Glucose, UA: NEGATIVE mg/dL
Ketones, ur: NEGATIVE mg/dL
Nitrite: NEGATIVE
Protein, ur: NEGATIVE mg/dL
Specific Gravity, Urine: 1.006 (ref 1.005–1.030)
Squamous Epithelial / HPF: 0 /[HPF] (ref 0–5)
pH: 6 (ref 5.0–8.0)

## 2023-12-02 MED ORDER — IBUPROFEN 600 MG PO TABS
600.0000 mg | ORAL_TABLET | Freq: Four times a day (QID) | ORAL | 0 refills | Status: AC | PRN
Start: 2023-12-02 — End: ?

## 2023-12-02 MED ORDER — OXYCODONE-ACETAMINOPHEN 5-325 MG PO TABS: 1.0000 | ORAL_TABLET | ORAL | 0 refills | Status: AC | PRN

## 2023-12-02 MED ORDER — LEVOFLOXACIN 500 MG PO TABS: 500.0000 mg | ORAL_TABLET | Freq: Every day | ORAL | 0 refills | Status: AC

## 2023-12-02 NOTE — Discharge Instructions (Signed)
You have epididymitis which is inflammation of a part outside the testicle called the epididymis.  Take the antibiotic as prescribed and finish the full course.  Take the ibuprofen every 6 hours with food to help decrease inflammation.  You may take the oxycodone as needed for breakthrough pain especially at night.  Return to the ER for new, worsening, or persistent severe pain, swelling, difficulty urinating, or any other new or worsening symptoms that concern you.

## 2023-12-02 NOTE — ED Provider Notes (Signed)
[facility] Provider Note    Event Date/Time   First MD Initiated Contact with Patient 12/02/23 1303     (approximate)   History   Testicle Pain   HPI  Dominic Armstrong. is a 37 y.o. male with no significant past medical history who presents with testicular pain after an injury 3 to 4 days ago.  The patient states that his daughter accidentally stepped on it.  Initially he had mild soreness there, but over the last couple of days the swelling increased and it became more painful, with the pain rating towards the buttock.  The patient denies any urinary frequency, dysuria, hematuria, or penile discharge.  He has no swelling to the right testicle.  He denies any other injuries.  I reviewed the past medical records.  The patient's most recent encounter was in the ED here in January 2023 with shoulder pain.  He has no other recent ED visits, admissions, or outpatient visits.   Physical Exam   Triage Vital Signs: ED Triage Vitals  Encounter Vitals Group     BP 12/02/23 1223 (!) 160/118     Systolic BP Percentile --      Diastolic BP Percentile --      Pulse Rate 12/02/23 1223 (!) 105     Resp 12/02/23 1223 19     Temp 12/02/23 1223 98 F (36.7 C)     Temp src --      SpO2 12/02/23 1223 100 %     Weight 12/02/23 1220 200 lb (90.7 kg)     Height 12/02/23 1220 6' (1.829 m)     Head Circumference --      Peak Flow --      Pain Score 12/02/23 1220 6     Pain Loc --      Pain Education --      Exclude from Growth Chart --     Most recent vital signs: Vitals:   12/02/23 1223  BP: (!) 160/118  Pulse: (!) 105  Resp: 19  Temp: 98 F (36.7 C)  SpO2: 100%     General: Awake, no distress.  CV:  Good peripheral perfusion.  Resp:  Normal effort.  Abd:  No distention.  Other:  Left testicular swelling with mild tenderness.  Mild epididymal tenderness.  No discharge at the meatus.  No lymphadenopathy.   ED Results / Procedures / Treatments    Labs (all labs ordered are listed, but only abnormal results are displayed) Labs Reviewed  URINALYSIS, ROUTINE W REFLEX MICROSCOPIC - Abnormal; Notable for the following components:      Result Value   Color, Urine YELLOW (*)    APPearance CLEAR (*)    Hgb urine dipstick SMALL (*)    Leukocytes,Ua TRACE (*)    Bacteria, UA RARE (*)    All other components within normal limits     EKG    RADIOLOGY  US scrotum: I independently viewed and interpreted the images; there is no evidence of testicular rupture.  Radiology report indicates the following:  IMPRESSION:  1. Findings suggestive of acute left epididymitis.  2. Small-moderate left hydrocele.  3. No evidence of testicular torsion or intratesticular mass.  4. Mild bilateral varicoceles.       PROCEDURES:  Critical Care performed: No  Procedures   MEDICATIONS ORDERED IN ED: Medications - No data to display   IMPRESSION / MDM / ASSESSMENT AND PLAN / ED COURSE  I reviewed the triage  vital signs and the nursing notes.              Differential diagnosis includes, but is not limited to, contusion, hydrocele, less likely testicular rupture.  We will obtain an ultrasound and UA for further evaluation.  Patient's presentation is most consistent with acute complicated illness / injury requiring diagnostic workup.  ----------------------------------------- 4:47 PM on 12/02/2023 -----------------------------------------  Urinalysis shows some WBCs but no other significant findings.  Ultrasound shows findings of epididymitis, which is likely traumatic.  I queried the patient and he has no high risk sexual history to suggest STI.  We will treat empirically with levofloxacin as well as ibuprofen and Percocet for pain.  I counseled the patient on the results of the workup and the likely etiology of his symptoms.  I gave strict return precautions and he expressed understanding.   FINAL CLINICAL IMPRESSION(S) / ED DIAGNOSES    Final diagnoses:  Epididymitis     Rx / DC Orders   ED Discharge Orders          Ordered    levofloxacin (LEVAQUIN) 500 MG tablet  Daily        12/02/23 1647    ibuprofen (ADVIL) 600 MG tablet  Every 6 hours PRN        12/02/23 1647    oxyCODONE-acetaminophen (PERCOCET) 5-325 MG tablet  Every 4 hours PRN        12/02/23 1647             Note:  This document was prepared using Dragon voice recognition software and may include unintentional dictation errors.    Dionne Bucy, MD 12/02/23 [phone]

## 2023-12-02 NOTE — ED Triage Notes (Signed)
Pt comes with left testicle pain. Pt states his daughter stepped on it. Pt states 3-4 days ago. Pt states pain and swelling.

## 2024-11-03 DIAGNOSIS — F17210 Nicotine dependence, cigarettes, uncomplicated: Secondary | ICD-10-CM | POA: Diagnosis not present

## 2024-11-03 DIAGNOSIS — S62330A Displaced fracture of neck of second metacarpal bone, right hand, initial encounter for closed fracture: Secondary | ICD-10-CM | POA: Diagnosis not present

## 2024-11-03 DIAGNOSIS — S0003XA Contusion of scalp, initial encounter: Secondary | ICD-10-CM | POA: Diagnosis not present

## 2024-11-03 DIAGNOSIS — S199XXA Unspecified injury of neck, initial encounter: Secondary | ICD-10-CM | POA: Diagnosis not present

## 2024-11-03 DIAGNOSIS — S62360A Nondisplaced fracture of neck of second metacarpal bone, right hand, initial encounter for closed fracture: Secondary | ICD-10-CM | POA: Diagnosis not present

## 2024-11-03 DIAGNOSIS — S0512XA Contusion of eyeball and orbital tissues, left eye, initial encounter: Secondary | ICD-10-CM | POA: Diagnosis not present

## 2024-11-03 DIAGNOSIS — Z880 Allergy status to penicillin: Secondary | ICD-10-CM | POA: Diagnosis not present

## 2024-11-03 DIAGNOSIS — S161XXA Strain of muscle, fascia and tendon at neck level, initial encounter: Secondary | ICD-10-CM | POA: Diagnosis not present

## 2024-11-30 DIAGNOSIS — F4310 Post-traumatic stress disorder, unspecified: Secondary | ICD-10-CM | POA: Diagnosis not present
